# Patient Record
Sex: Female | Born: 2000 | Race: Black or African American | Hispanic: No | Marital: Single | Smoking: Never smoker
Health system: Southern US, Community
[De-identification: ages and names within clinical notes are randomized; demographics above are authoritative.]

## PROBLEM LIST (undated history)

## (undated) HISTORY — PX: OTHER SURGICAL HISTORY: SHX169

---
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
## 2019-01-08 IMAGING — US US OB LIMITED
1 series · 14 of 22 positions shown · non-contrast
Comparison: none

CLINICAL DATA: Second trimester of pregnancy, acute left upper
quadrant abdominal pain.

EXAM:
LIMITED OBSTETRIC ULTRASOUND

[Series 1: us ob limited · 0.24mm/px · 14 of 22 slices shown]
[im 1/22]
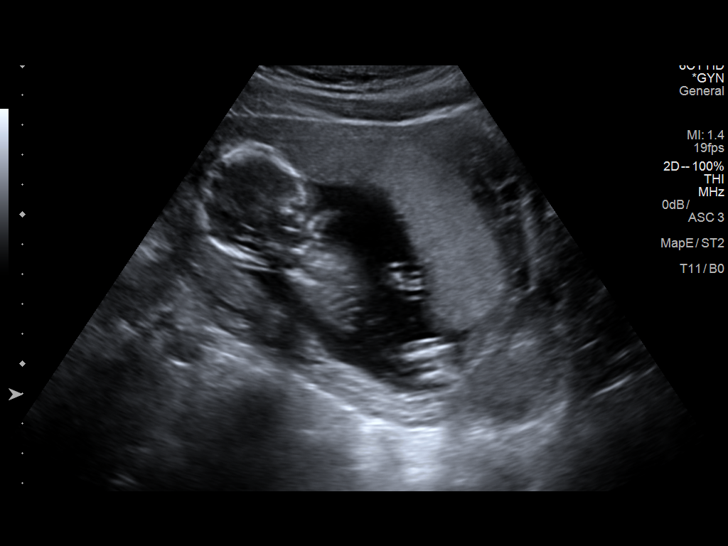
[im 3/22]
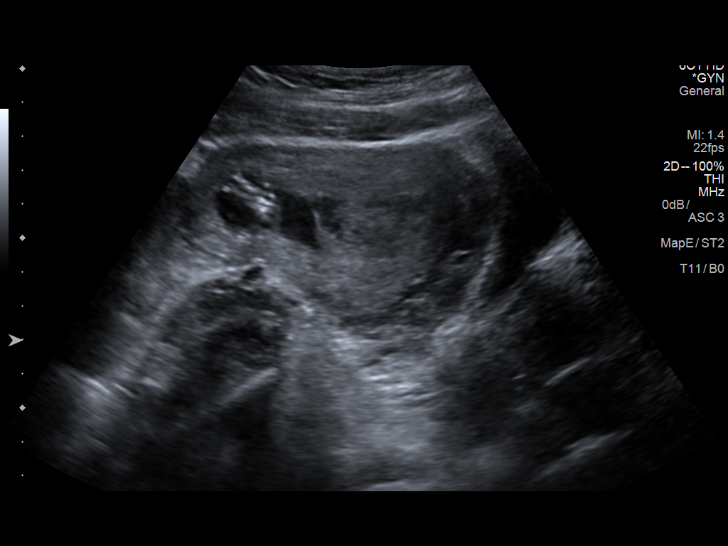
[im 4/22]
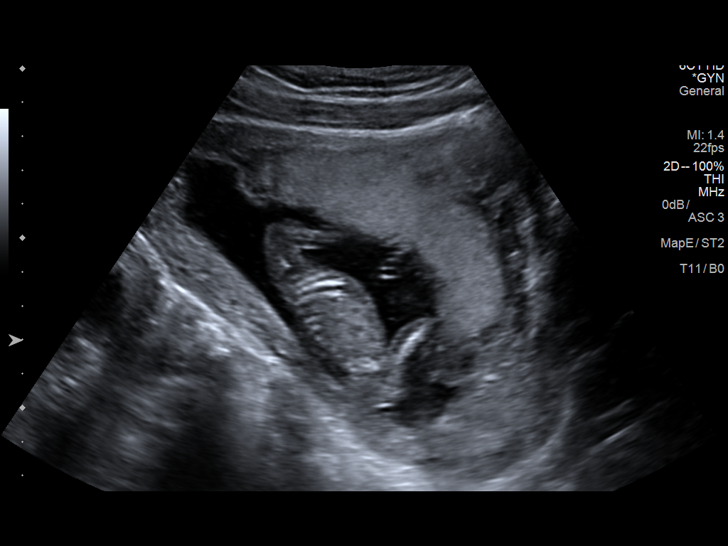
[im 6/22]
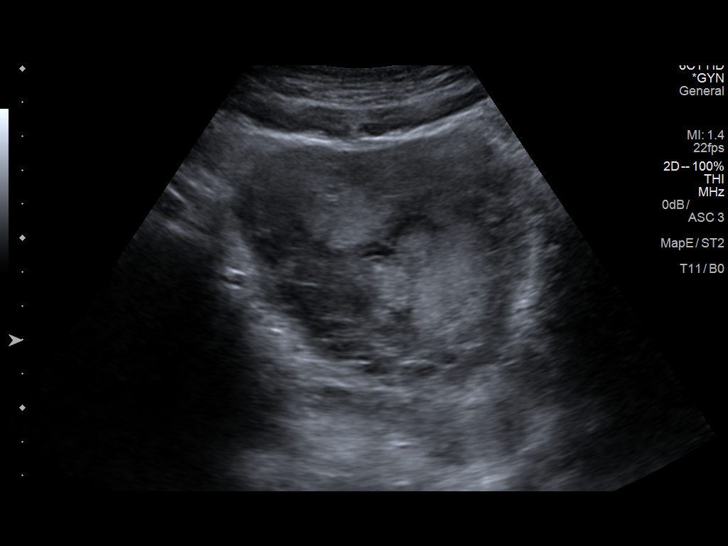
[im 8/22]
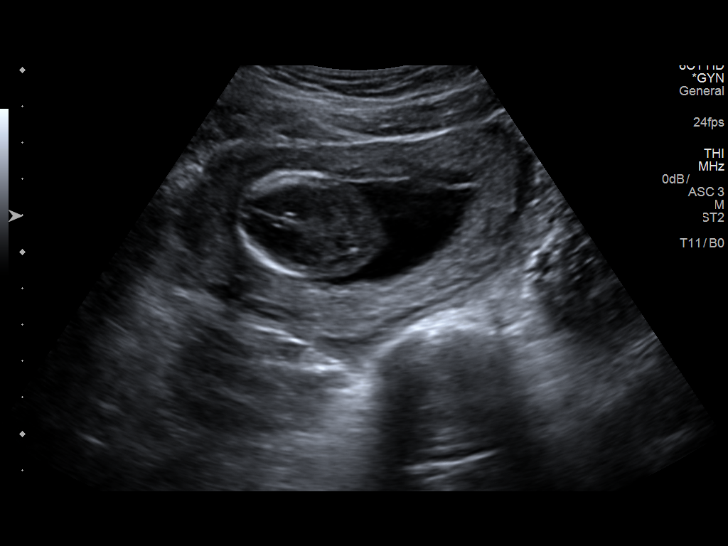
[im 9/22]
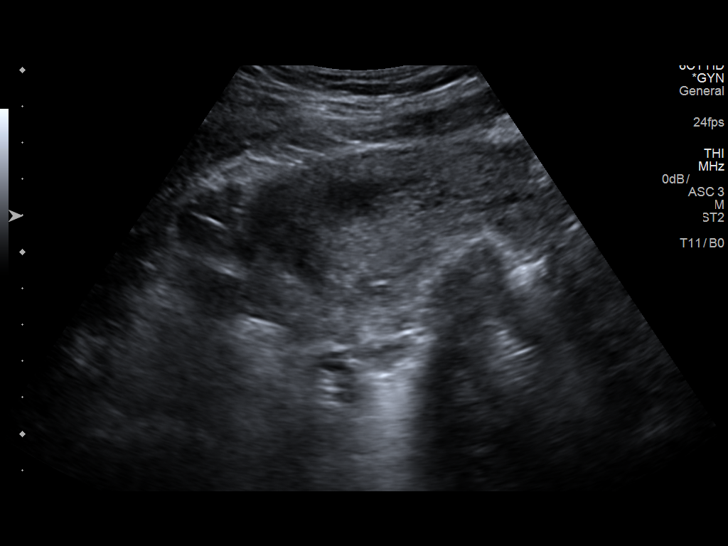
[im 11/22]
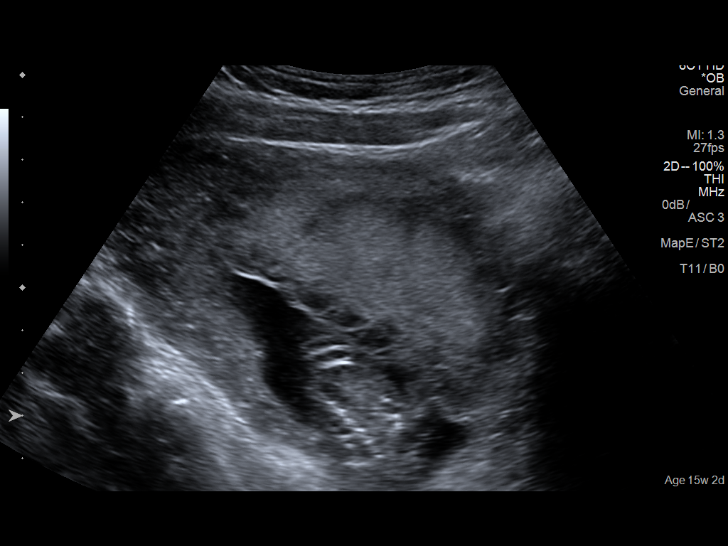
[im 12/22]
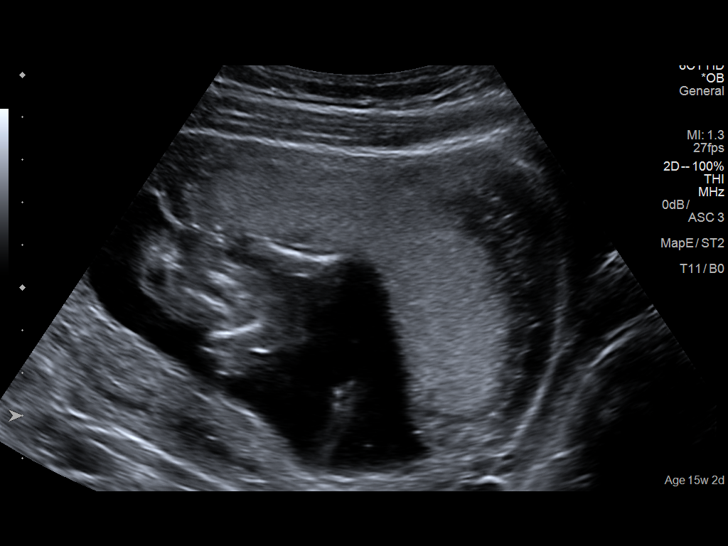
[im 14/22]
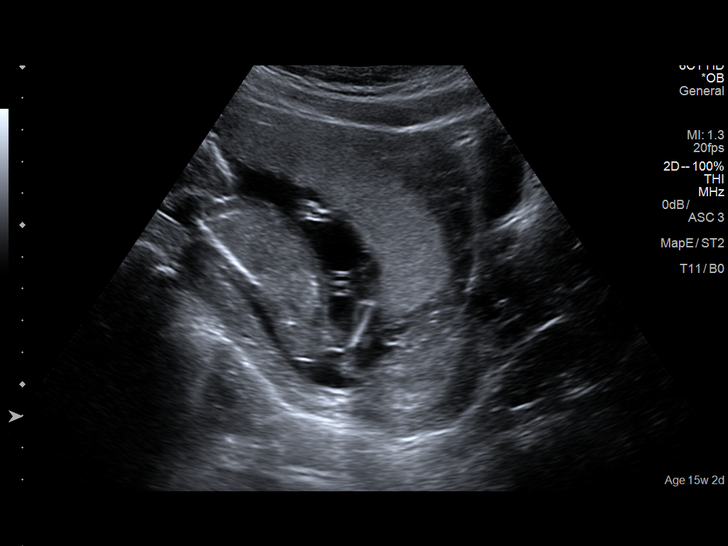
[im 15/22]
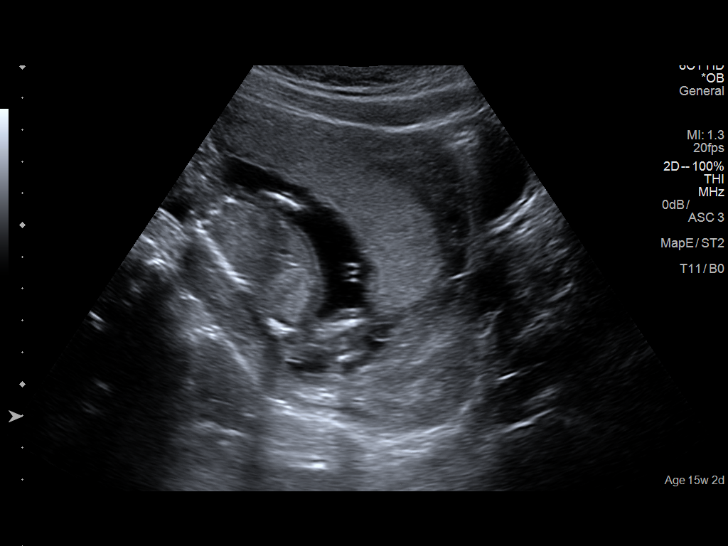
[im 17/22]
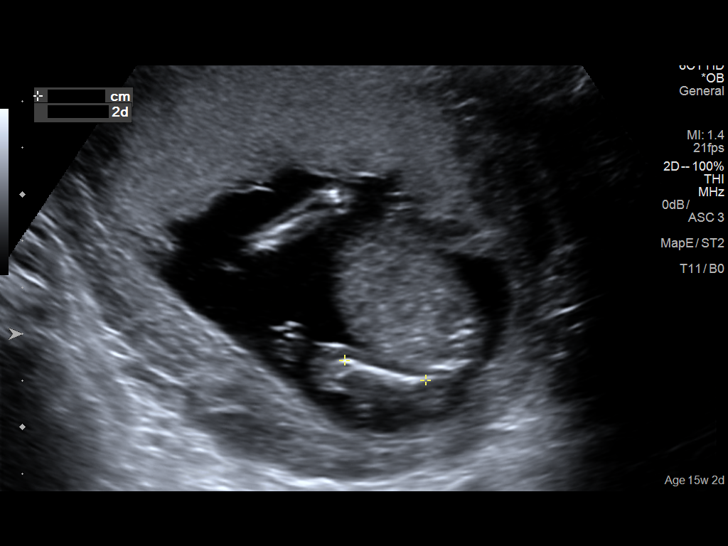
[im 19/22]
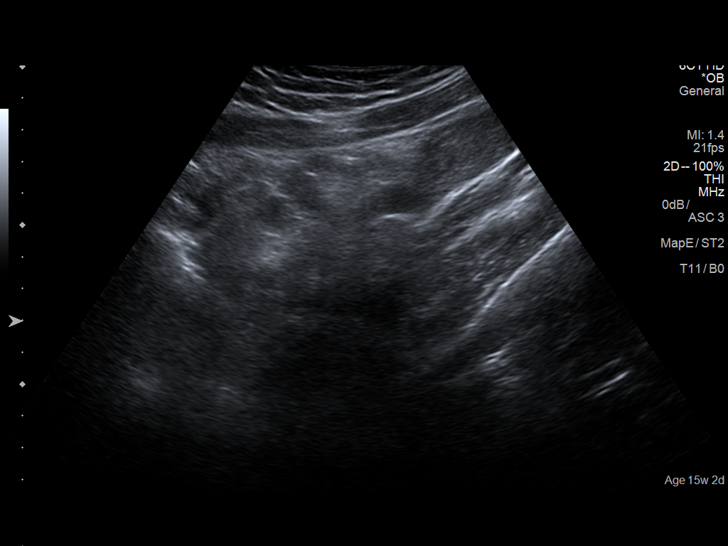
[im 20/22]
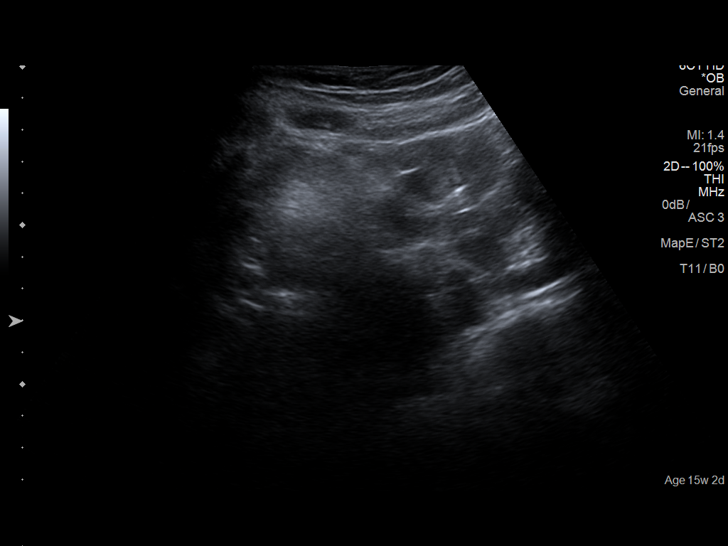
[im 22/22]
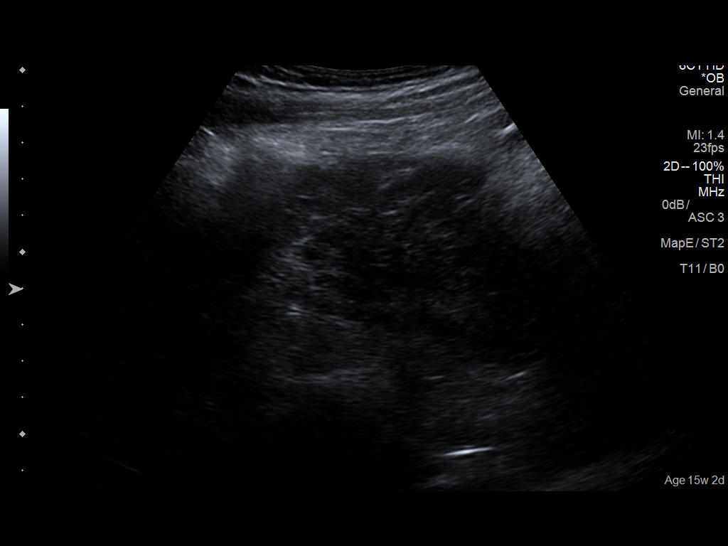

[14 of 22 positions shown; findings below may reference images not displayed]

FINDINGS: Number of Fetuses: 1

Heart Rate:  150 bpm

Movement: Yes.

Presentation: Breech

Placental Location: Anterior

Previa: No

Amniotic Fluid (Subjective):  Within normal limits.

Femur length:  1.8cm 15w  2d

MATERNAL FINDINGS:

Cervix:  Appears closed.

Uterus/Adnexae: No abnormality visualized.
IMPRESSION: Single live intrauterine gestation of 15 weeks 2 days.

This exam is performed on an emergent basis and does not
comprehensively evaluate fetal size, dating, or anatomy; follow-up
complete OB US should be considered if further fetal assessment is
warranted.

## 2021-09-14 ENCOUNTER — Encounter (HOSPITAL_COMMUNITY): Payer: Self-pay

## 2021-09-14 ENCOUNTER — Other Ambulatory Visit: Payer: Self-pay

## 2021-09-14 ENCOUNTER — Emergency Department (HOSPITAL_COMMUNITY)
Admission: EM | Admit: 2021-09-14 | Discharge: 2021-09-14 | Disposition: A | Discharge status: Home / Self Care | Payer: Medicaid Other | Attending: Emergency Medicine | Admitting: Emergency Medicine

## 2021-09-14 DIAGNOSIS — N898 Other specified noninflammatory disorders of vagina: Secondary | ICD-10-CM | POA: Diagnosis present

## 2021-09-14 DIAGNOSIS — B9689 Other specified bacterial agents as the cause of diseases classified elsewhere: Secondary | ICD-10-CM

## 2021-09-14 DIAGNOSIS — Z113 Encounter for screening for infections with a predominantly sexual mode of transmission: Secondary | ICD-10-CM

## 2021-09-14 DIAGNOSIS — N760 Acute vaginitis: Secondary | ICD-10-CM | POA: Insufficient documentation

## 2021-09-14 LAB — URINALYSIS, ROUTINE W REFLEX MICROSCOPIC
Bacteria, UA: NONE SEEN
Bilirubin Urine: NEGATIVE
Glucose, UA: NEGATIVE mg/dL
Hgb urine dipstick: NEGATIVE
Ketones, ur: 5 mg/dL — AB
Leukocytes,Ua: NEGATIVE
Nitrite: NEGATIVE
Protein, ur: NEGATIVE mg/dL
Specific Gravity, Urine: 1.026 (ref 1.005–1.030)
pH: 5 (ref 5.0–8.0)

## 2021-09-14 LAB — WET PREP, GENITAL
Sperm: NONE SEEN
Trich, Wet Prep: NONE SEEN
WBC, Wet Prep HPF POC: <10 (ref <10)
Yeast Wet Prep HPF POC: NONE SEEN

## 2021-09-14 MED ORDER — DOXYCYCLINE HYCLATE 100 MG PO CAPS: 100.0000 mg | ORAL_CAPSULE | Freq: Two times a day (BID) | ORAL | 0 refills | Status: DC

## 2021-09-14 MED ORDER — STERILE WATER FOR INJECTION IJ SOLN
INTRAMUSCULAR | Status: AC
  Filled 2021-09-14: qty 10

## 2021-09-14 MED ORDER — CEFTRIAXONE SODIUM 1 G IJ SOLR
500.0000 mg | Freq: Once | INTRAMUSCULAR | Status: AC
  Administered 2021-09-14: 500 mg via INTRAMUSCULAR
  Filled 2021-09-14: qty 10

## 2021-09-14 MED ORDER — METRONIDAZOLE 500 MG PO TABS: 500.0000 mg | ORAL_TABLET | Freq: Two times a day (BID) | ORAL | 0 refills | Status: DC

## 2021-09-14 MED ORDER — METRONIDAZOLE 500 MG PO TABS
500.0000 mg | ORAL_TABLET | Freq: Once | ORAL | Status: AC
  Administered 2021-09-14: 500 mg via ORAL
  Filled 2021-09-14: qty 1

## 2021-09-14 NOTE — ED Triage Notes (Signed)
Pt states that she wiped today and had white clumpy discharge. Pt states that her lower abdomen was aching but now it is not. Pt reports being sexually active and not using protection.

## 2021-09-14 NOTE — ED Provider Notes (Signed)
[facility]-EMERGENCY DEPT Provider Note   CSN: [id number] Arrival date & time: 09/14/21  2100     History  Chief Complaint  Patient presents with   Exposure to STD    Molly Doyle is a 21 y.o. female.  Patient presents to the emergency department for evaluation of vaginal discharge.  She has noted a white clumpy discharge with odor over the past several days.  She is also concerned because she has had unprotected sexual intercourse and could have a sexually transmitted infection.  She has some mild lower abdominal pain.  No dysuria, increased frequency or urgency.  No fevers, nausea or vomiting.  No treatments prior to arrival.  She has never had a yeast infection before.      Home Medications Prior to Admission medications   Not on File      Allergies    Patient has no known allergies.    Review of Systems   Review of Systems  Physical Exam Updated Vital Signs BP 134/79 (BP Location: Left Arm)   Pulse 87   Temp 98.9 F (37.2 C) (Oral)   Resp 16   Ht 5\' 5"  (1.651 m)   Wt 65.8 kg   SpO2 100%   BMI 24.13 kg/m  Physical Exam Vitals and nursing note reviewed. Exam conducted with a chaperone present.  Constitutional:      General: She is not in acute distress.    Appearance: She is well-developed.  HENT:     Head: Normocephalic and atraumatic.     Right Ear: External ear normal.     Left Ear: External ear normal.     Nose: Nose normal.  Eyes:     Conjunctiva/sclera: Conjunctivae normal.  Cardiovascular:     Rate and Rhythm: Normal rate and regular rhythm.     Heart sounds: No murmur heard. Pulmonary:     Effort: No respiratory distress.     Breath sounds: No wheezing, rhonchi or rales.  Abdominal:     Palpations: Abdomen is soft.     Tenderness: There is no abdominal tenderness. There is no guarding or rebound.  Genitourinary:    Exam position: Lithotomy position.     Labia:        Right: No rash, tenderness or lesion.         Left: No rash, tenderness or lesion.      Vagina: Vaginal discharge present. No bleeding.     Cervix: No cervical motion tenderness or cervical bleeding.     Uterus: Not tender.      Adnexa: Right adnexa normal and left adnexa normal.       Right: No tenderness or fullness.         Left: No tenderness or fullness.       Comments: Exam not suggestive of PID with minimal discomfort. There is a mild thick/white heterogeneous discharge.  Musculoskeletal:     Cervical back: Normal range of motion and neck supple.     Right lower leg: No edema.     Left lower leg: No edema.  Skin:    General: Skin is warm and dry.     Findings: No rash.  Neurological:     General: No focal deficit present.     Mental Status: She is alert. Mental status is at baseline.     Motor: No weakness.  Psychiatric:        Mood and Affect: Mood normal.    ED Results / Procedures /  Treatments   Labs (all labs ordered are listed, but only abnormal results are displayed) Labs Reviewed  WET PREP, GENITAL - Abnormal; Notable for the following components:      Result Value   Clue Cells Wet Prep HPF POC PRESENT (*)    All other components within normal limits  URINALYSIS, ROUTINE W REFLEX MICROSCOPIC - Abnormal; Notable for the following components:   Ketones, ur 5 (*)    All other components within normal limits  RPR  HIV ANTIBODY (ROUTINE TESTING W REFLEX)  GC/CHLAMYDIA PROBE AMP ([place]) NOT AT [facility]    EKG None  Radiology No results found.  Procedures Procedures    Medications Ordered in ED Medications  cefTRIAXone (ROCEPHIN) injection 500 mg (has no administration in time range)  metroNIDAZOLE (FLAGYL) tablet 500 mg (has no administration in time range)    ED Course/ Medical Decision Making/ A&P    Patient seen and examined. History obtained directly from patient.  We discussed pelvic exam to collect samples and to evaluate for potential pelvic inflammatory disease versus foregoing pelvic  exam and having patient do a self swab and urine test for GC and chlamydia --patient would prefer to have pelvic exam performed.  Labs/EKG: Ordered GC/chlamydia, wet prep, UA, HIV, RPR  Imaging: None ordered  Medications/Fluids: None ordered  Most recent vital signs reviewed and are as follows: BP 134/79 (BP Location: Left Arm)   Pulse 87   Temp 98.9 F (37.2 C) (Oral)   Resp 16   Ht 5\' 5"  (1.651 m)   Wt 65.8 kg   SpO2 100%   BMI 24.13 kg/m   Initial impression: vaginal discharge, at risk for STI  10:52 PM Pelvic exam performed with RN chaperone.   11:19 PM Reassessment performed. Patient appears stable.  Labs personally reviewed and interpreted including: UA without signs of infection, wet prep with clue cells.  Reviewed pertinent lab work and imaging with patient at bedside. Questions answered.  We discussed empiric treatment for sexual transmitted infections and patient would like this.  She will be given a dose of IM Rocephin here.  She will be given prescription for doxycycline and metronidazole.  Most current vital signs reviewed and are as follows: BP 134/79 (BP Location: Left Arm)   Pulse 87   Temp 98.9 F (37.2 C) (Oral)   Resp 16   Ht 5\' 5"  (1.651 m)   Wt 65.8 kg   SpO2 100%   BMI 24.13 kg/m   Plan: Discharge to home.   Other home care instructions discussed: Will test and treat for STD exposure. Patient offered HIV and syphilis testing. Patient counseled on safe sexual practices, encouraged them to avoid sexual contact for 7 days and to inform sexual partners so that they can get tested and treated as well. Patient verbalizes understanding and agrees with plan.    Patient urged to return with worsening symptoms or other concerns. Patient verbalized understanding and agrees with plan.                           Medical Decision Making Amount and/or Complexity of Data Reviewed Labs: ordered.   Patient presented with vaginal discharge.  She was evaluated  with pelvic exam.  Wet prep with clue cells, negative for trichomonas and yeast infection.  HIV, RPR, GC and chlamydia pending.  Patient opts for empiric treatment of potential STI.  She will be treated for bacterial vaginosis.  No concern for  PID given her exam today.  Abdomen is otherwise soft and nontender and low concern for other intra-abdominal etiology.        Final Clinical Impression(s) / ED Diagnoses Final diagnoses:  Vaginal discharge  Screen for STD (sexually transmitted disease)  BV (bacterial vaginosis)    Rx / DC Orders ED Discharge Orders          Ordered    doxycycline (VIBRAMYCIN) 100 MG capsule  2 times daily        09/14/21 2317    metroNIDAZOLE (FLAGYL) 500 MG tablet  2 times daily        09/14/21 2317              Renne CriglerGeiple, Quina Wilbourne, PA-C 09/14/21 2322    Terrilee FilesButler, Michael C, MD 09/15/21 1048

## 2021-09-14 NOTE — ED Notes (Addendum)
Urine specimen/culture obtained, placed in triage urine collection bin, holding for order.

## 2021-09-14 NOTE — Discharge Instructions (Signed)
Please read and follow all provided instructions.  Your diagnoses today include:  1. Vaginal discharge   2. Screen for STD (sexually transmitted disease)   3. BV (bacterial vaginosis)     Tests performed today include: Test for gonorrhea and chlamydia.  Wet prep: Was negative for trichomonas and yeast infection, suggested bacterial vaginosis Test for HIV and syphilis.  You will be notified by telephone with any positive results.  Vital signs. See below for your results today.   Medications:  For treatment of gonorrhea: You were treated with a rocephin (shot) today.   For treatment of chlamydia: If you test negative, you can discard the prescription. You can check your results on the internet through your MyChart. Results typically return in 48 hours.   You were also given metronidazole (pills) that treat for bacterial vaginosis. Please fill the prescription for metronidazole and take until completed.  Avoid alcohol when taking this medication as it can cause severe vomiting.  Home care instructions:  Read educational materials contained in this packet and follow any instructions provided.   You should tell your partners about your infection and avoid having sex for one week to allow time for the medicine to work.  Sexually transmitted disease testing also available at:  [facility] [place], [facility] [address] [place], phone [phone] or [phone]   Monday - Friday, call for an appointment  Return instructions:  Please return to the Emergency Department if you experience worsening symptoms.  Please return if you have any other emergent concerns.  Additional Information:  Your vital signs today were: BP 134/79 (BP Location: Left Arm)   Pulse 87   Temp 98.9 F (37.2 C) (Oral)   Resp 16   Ht 5\' 5"  (1.651 m)   Wt 65.8 kg   SpO2 100%   BMI 24.13 kg/m  If your blood pressure (BP) was elevated above 135/85 this visit,  please have this repeated by your doctor within one month. --------------

## 2021-09-15 LAB — GC/CHLAMYDIA PROBE AMP (~~LOC~~) NOT AT ARMC
Chlamydia: POSITIVE — AB
Comment: NEGATIVE
Comment: NORMAL
Neisseria Gonorrhea: NEGATIVE

## 2021-09-15 LAB — HIV ANTIBODY (ROUTINE TESTING W REFLEX): HIV Screen 4th Generation wRfx: NONREACTIVE

## 2021-09-15 LAB — RPR: RPR Ser Ql: NONREACTIVE

## 2021-09-16 ENCOUNTER — Telehealth: Payer: Self-pay | Admitting: *Deleted

## 2021-09-16 NOTE — Telephone Encounter (Signed)
Pt called regarding written Rx that she left in hospital at discharge.  RNCM called in Rx to pharmacy of choice ([facility] in [place], [place]).

## 2023-06-15 ENCOUNTER — Other Ambulatory Visit: Payer: Self-pay

## 2023-06-15 ENCOUNTER — Ambulatory Visit
Admission: RE | Admit: 2023-06-15 | Discharge: 2023-06-15 | Disposition: A | Discharge status: Home / Self Care | Payer: MEDICAID | Source: Ambulatory Visit | Attending: Internal Medicine | Admitting: Internal Medicine

## 2023-06-15 VITALS — BP 116/77 | HR 70 | Temp 99.0°F | Resp 16 | Wt 150.0 lb

## 2023-06-15 DIAGNOSIS — R1030 Lower abdominal pain, unspecified: Secondary | ICD-10-CM | POA: Diagnosis not present

## 2023-06-15 DIAGNOSIS — N898 Other specified noninflammatory disorders of vagina: Secondary | ICD-10-CM | POA: Insufficient documentation

## 2023-06-15 DIAGNOSIS — Z113 Encounter for screening for infections with a predominantly sexual mode of transmission: Secondary | ICD-10-CM | POA: Insufficient documentation

## 2023-06-15 LAB — POCT URINALYSIS DIP (MANUAL ENTRY)
Bilirubin, UA: NEGATIVE
Blood, UA: NEGATIVE
Glucose, UA: NEGATIVE mg/dL
Ketones, POC UA: NEGATIVE mg/dL
Leukocytes, UA: NEGATIVE
Nitrite, UA: NEGATIVE
Protein Ur, POC: NEGATIVE mg/dL
Spec Grav, UA: >=1.03 — AB (ref 1.010–1.025)
Urobilinogen, UA: 0.2 U/dL
pH, UA: 6 (ref 5.0–8.0)

## 2023-06-15 LAB — POCT URINE PREGNANCY: Preg Test, Ur: NEGATIVE

## 2023-06-15 MED ORDER — FLUCONAZOLE 150 MG PO TABS: 150.0000 mg | ORAL_TABLET | ORAL | 0 refills | Status: AC

## 2023-06-15 MED ORDER — METRONIDAZOLE 500 MG PO TABS: 500.0000 mg | ORAL_TABLET | Freq: Two times a day (BID) | ORAL | 0 refills | Status: AC

## 2023-06-15 NOTE — ED Triage Notes (Signed)
 C/O white "clumpy" vaginal discharge and lower abdominal pain x 4 days. Patient states hx of BV 2 months ago. Patient states she is not sexually active at this time.

## 2023-06-15 NOTE — Discharge Instructions (Signed)
 Your swab and blood work were sent to the lab for further testing.  You will be called with results. Flagyl is an antibiotic given to treat vaginal infections and diflucan is given to treat yeast infections. Take the prescriptions as directed.  You should avoid all sexual activity until you have been notified of all your results and have undergone any necessary treatment.  If you are positive, it is recommended that you inform all sexual partners so they can treat be treated as well before having sex again.

## 2023-06-15 NOTE — ED Provider Notes (Signed)
 [facility]  Note:  This document was prepared using Dragon voice recognition software and may include unintentional dictation errors.  MRN: [id number] DOB: 2000-07-01 DATE: 06/15/23   Subjective:  Chief Complaint:  Chief Complaint  Patient presents with   Abdominal Pain    I keep having pain in my stomach and waist ! - Entered by patient   Vaginal Discharge     HPI: Molly Doyle is a 23 y.o. female presenting for vaginal discharge and lower abdominal pain for the past 4 days. Patient reports a clumpy white discharge with vaginal pruritus. Reports no vaginal odor or lesions. She reports frequent episodes of BV with similar symptoms. She is not currently sexually active, but reports she last had sex about a month ago. She is requesting full STD testing. Per EMR, patient has a history of chlamydia a year ago. Denies fever, nausea/vomiting, dysuria, hematuria, vaginal odor. Endorses vaginal discharge, vaginal pruritus, lower abdominal pain. Presents NAD.  Prior to Admission medications   Medication Sig Start Date End Date Taking? Authorizing Provider  fluconazole (DIFLUCAN) 150 MG tablet Take 1 tablet (150 mg total) by mouth every 3 (three) days for 2 doses. 06/15/23 06/19/23 Yes Haille Pardi P, PA-C  metroNIDAZOLE (FLAGYL) 500 MG tablet Take 1 tablet (500 mg total) by mouth every 12 (twelve) hours for 7 days. 06/15/23 06/22/23 Yes Bobbijo Holst P, PA-C     No Known Allergies  History:   History reviewed. No pertinent past medical history.   Past Surgical History:  Procedure Laterality Date   ovarian cysts      History reviewed. No pertinent family history.  Social History   Tobacco Use   Smoking status: Never   Smokeless tobacco: Never  Substance Use Topics   Alcohol use: No   Drug use: No    Review of Systems  Constitutional:  Negative for fever.  Gastrointestinal:  Positive for abdominal pain. Negative for nausea and vomiting.  Genitourinary:  Positive  for vaginal discharge. Negative for dysuria, flank pain, genital sores, hematuria and vaginal bleeding.  Musculoskeletal:  Negative for back pain.     Objective:   Vitals: BP 116/77 (BP Location: Right Arm)   Pulse 70   Temp 99 F (37.2 C) (Oral)   Resp 16   Wt 150 lb (68 kg)   LMP 04/23/2023   SpO2 98%   BMI 24.96 kg/m   Physical Exam Constitutional:      General: She is not in acute distress.    Appearance: Normal appearance. She is well-developed and normal weight. She is not ill-appearing or toxic-appearing.  HENT:     Head: Normocephalic and atraumatic.  Cardiovascular:     Rate and Rhythm: Normal rate and regular rhythm.     Heart sounds: Normal heart sounds.  Pulmonary:     Effort: Pulmonary effort is normal.     Breath sounds: Normal breath sounds.     Comments: Clear to auscultation bilaterally  Abdominal:     General: Bowel sounds are normal.     Palpations: Abdomen is soft.     Tenderness: There is no abdominal tenderness. There is no right CVA tenderness or left CVA tenderness.  Skin:    General: Skin is warm and dry.  Neurological:     General: No focal deficit present.     Mental Status: She is alert.  Psychiatric:        Mood and Affect: Mood and affect normal.     Results:  Labs:  Results for orders placed or performed during the hospital encounter of 06/15/23 (from the past 24 hours)  POCT urinalysis dipstick     Status: Abnormal   Collection Time: 06/15/23  8:45 AM  Result Value Ref Range   Color, UA yellow yellow   Clarity, UA clear clear   Glucose, UA negative negative mg/dL   Bilirubin, UA negative negative   Ketones, POC UA negative negative mg/dL   Spec Grav, UA >=4.696 (A) 1.010 - 1.025   Blood, UA negative negative   pH, UA 6.0 5.0 - 8.0   Protein Ur, POC negative negative mg/dL   Urobilinogen, UA 0.2 0.2 or 1.0 E.U./dL   Nitrite, UA Negative Negative   Leukocytes, UA Negative Negative  POCT urine pregnancy     Status: None    Collection Time: 06/15/23  8:46 AM  Result Value Ref Range   Preg Test, Ur Negative Negative    Radiology: No results found.   UC Course/Treatments:  Procedures: Procedures   Medications Ordered in UC: Medications - No data to display   Assessment and Plan :     ICD-10-CM   1. Vaginal discharge  N89.8 Cervicovaginal ancillary only    Cervicovaginal ancillary only    2. Lower abdominal pain  R10.30     3. Screening for STDs (sexually transmitted diseases)  Z11.3 HIV Antibody (routine testing w rflx)    HIV4GL Save Tube    RPR    HIV Antibody (routine testing w rflx)    HIV4GL Save Tube    RPR     Vaginal discharge Afebrile, nontoxic-appearing, NAD. VSS. DDX includes but not limited to: BV, yeast, STD Cytology is pending. UPT was negative and UA unremarkable. Given pruritus and history of BV with similar symptoms, diflucan 150mg  every 72 hours and Flagyl 500mg  BID was prescribed. Strict ED precautions were given and patient verbalized understanding.  Lower abdominal pain Afebrile, nontoxic-appearing, NAD. VSS. DDX includes but not limited to: Cystitis, BV, yeast, STD, PID, appendicitis, diverticulitis Cytology is pending. UPT was negative and UA unremarkable. Given pruritus and history of BV with similar symptoms, diflucan 150mg  every 72 hours and Flagyl 500mg  BID was prescribed. Strict ED precautions were given and patient verbalized understanding.  Screening for STDs (sexually transmitted diseases) Afebrile, nontoxic-appearing, NAD. VSS. Cytology, HIV, and RPR are pending. Safe sex precautions advised. Strict ED precautions were given and patient verbalized understanding.   ED Discharge Orders          Ordered    metroNIDAZOLE (FLAGYL) 500 MG tablet  Every 12 hours        06/15/23 0851    fluconazole (DIFLUCAN) 150 MG tablet  every 72 hours        06/15/23 0851             PDMP not reviewed this encounter.     Auri Jahnke P, PA-C 06/15/23 0901

## 2023-06-16 LAB — CERVICOVAGINAL ANCILLARY ONLY
Bacterial Vaginitis (gardnerella): NEGATIVE
Candida Glabrata: NEGATIVE
Candida Vaginitis: POSITIVE — AB
Chlamydia: NEGATIVE
Comment: NEGATIVE
Comment: NEGATIVE
Comment: NEGATIVE
Comment: NEGATIVE
Comment: NEGATIVE
Comment: NORMAL
Neisseria Gonorrhea: NEGATIVE
Trichomonas: NEGATIVE

## 2023-06-16 LAB — HIV ANTIBODY (ROUTINE TESTING W REFLEX): HIV Screen 4th Generation wRfx: NONREACTIVE

## 2023-06-16 LAB — RPR: RPR Ser Ql: NONREACTIVE
